# Patient Record
Sex: Male | Born: 1950 | Race: White | Hispanic: No | Marital: Married | Smoking: Never smoker
Health system: Southern US, Community
[De-identification: ages and names within clinical notes are randomized; demographics above are authoritative.]

## PROBLEM LIST (undated history)

## (undated) DIAGNOSIS — E119 Type 2 diabetes mellitus without complications: Secondary | ICD-10-CM

## (undated) DIAGNOSIS — I10 Essential (primary) hypertension: Secondary | ICD-10-CM

## (undated) DIAGNOSIS — I4891 Unspecified atrial fibrillation: Secondary | ICD-10-CM

---

## 2018-05-30 ENCOUNTER — Encounter (HOSPITAL_BASED_OUTPATIENT_CLINIC_OR_DEPARTMENT_OTHER): Payer: Self-pay | Admitting: Emergency Medicine

## 2018-05-30 ENCOUNTER — Other Ambulatory Visit: Payer: Self-pay

## 2018-05-30 ENCOUNTER — Emergency Department (HOSPITAL_BASED_OUTPATIENT_CLINIC_OR_DEPARTMENT_OTHER): Payer: Medicare HMO

## 2018-05-30 ENCOUNTER — Emergency Department (HOSPITAL_BASED_OUTPATIENT_CLINIC_OR_DEPARTMENT_OTHER)
Admission: EM | Admit: 2018-05-30 | Discharge: 2018-05-30 | Disposition: A | Discharge status: Home / Self Care | Payer: Medicare HMO | Attending: Emergency Medicine | Admitting: Emergency Medicine

## 2018-05-30 DIAGNOSIS — Y929 Unspecified place or not applicable: Secondary | ICD-10-CM | POA: Insufficient documentation

## 2018-05-30 DIAGNOSIS — Y999 Unspecified external cause status: Secondary | ICD-10-CM | POA: Diagnosis not present

## 2018-05-30 DIAGNOSIS — Z7984 Long term (current) use of oral hypoglycemic drugs: Secondary | ICD-10-CM | POA: Diagnosis not present

## 2018-05-30 DIAGNOSIS — Z7982 Long term (current) use of aspirin: Secondary | ICD-10-CM | POA: Diagnosis not present

## 2018-05-30 DIAGNOSIS — I4891 Unspecified atrial fibrillation: Secondary | ICD-10-CM | POA: Insufficient documentation

## 2018-05-30 DIAGNOSIS — E119 Type 2 diabetes mellitus without complications: Secondary | ICD-10-CM | POA: Insufficient documentation

## 2018-05-30 DIAGNOSIS — I10 Essential (primary) hypertension: Secondary | ICD-10-CM | POA: Diagnosis not present

## 2018-05-30 DIAGNOSIS — X58XXXA Exposure to other specified factors, initial encounter: Secondary | ICD-10-CM | POA: Insufficient documentation

## 2018-05-30 DIAGNOSIS — Z79899 Other long term (current) drug therapy: Secondary | ICD-10-CM | POA: Diagnosis not present

## 2018-05-30 DIAGNOSIS — S76211A Strain of adductor muscle, fascia and tendon of right thigh, initial encounter: Secondary | ICD-10-CM

## 2018-05-30 DIAGNOSIS — S79921A Unspecified injury of right thigh, initial encounter: Secondary | ICD-10-CM | POA: Diagnosis present

## 2018-05-30 DIAGNOSIS — Y939 Activity, unspecified: Secondary | ICD-10-CM | POA: Diagnosis not present

## 2018-05-30 HISTORY — DX: Unspecified atrial fibrillation: I48.91

## 2018-05-30 HISTORY — DX: Type 2 diabetes mellitus without complications: E11.9

## 2018-05-30 HISTORY — DX: Essential (primary) hypertension: I10

## 2018-05-30 MED ORDER — PREDNISONE 10 MG PO TABS: ORAL_TABLET | ORAL | 0 refills | Status: AC

## 2018-05-30 NOTE — ED Notes (Signed)
ED Provider at bedside. 

## 2018-05-30 NOTE — ED Triage Notes (Signed)
Reports hip injury in inversion table x 2 weeks.  Reports limping gait.  Ambulatory to triage in NAD.

## 2018-05-30 NOTE — Discharge Instructions (Signed)
See your Orthopaedist for recheck next week if symptoms persist

## 2018-05-30 NOTE — ED Provider Notes (Signed)
[facility] EMERGENCY DEPARTMENT Provider Note   CSN: [id number] Arrival date & time: 05/30/18  1946     History   Chief Complaint Chief Complaint  Patient presents with  . Hip Pain    HPI Nicholas Stevenson is a 67 y.o. male.  The history is provided by the patient. No language interpreter was used.  Hip Pain  This is a new problem. Episode onset: 2 weeks. The problem occurs constantly. Pertinent negatives include no abdominal pain. Nothing aggravates the symptoms. Nothing relieves the symptoms. He has tried nothing for the symptoms. The treatment provided no relief.   Pt reports he thinks he strained right groin on inversion table Pt reports he has had pain for 2 weeks.   Past Medical History:  Diagnosis Date  . Atrial fibrillation (HCC)   . Diabetes mellitus without complication (HCC)   . Hypertension     There are no active problems to display for this patient.   History reviewed. No pertinent surgical history.      Home Medications    Prior to Admission medications   Medication Sig Start Date End Date Taking? Authorizing Provider  aspirin EC 81 MG tablet Take 81 mg by mouth daily.   Yes [provider]  Celecoxib (CELEBREX PO) Take by mouth.   Yes [provider]  dilTIAZem HCl Coated Beads (DILTIAZEM CD PO) Take by mouth.   Yes [provider]  LISINOPRIL PO Take by mouth.   Yes [provider]  METFORMIN HCL PO Take by mouth.   Yes [provider]  METOPROLOL TARTRATE PO Take by mouth.   Yes [provider]    Family History History reviewed. No pertinent family history.  Social History Social History   Tobacco Use  . Smoking status: Never Smoker  . Smokeless tobacco: Never Used  Substance Use Topics  . Alcohol use: Never    Frequency: Never  . Drug use: Never     Allergies   Patient has no known allergies.   Review of Systems Review of Systems  Gastrointestinal: Negative for  abdominal pain.  All other systems reviewed and are negative.    Physical Exam Updated Vital Signs BP 128/68 (BP Location: Right Arm)   Pulse 61   Temp (!) 97.5 F (36.4 C) (Oral)   Resp 16   Ht 6' (1.829 m)   Wt 104.3 kg   SpO2 98%   BMI 31.19 kg/m   Physical Exam  Constitutional: He is oriented to person, place, and time. He appears well-developed and well-nourished.  HENT:  Head: Normocephalic.  Eyes: EOM are normal.  Neck: Normal range of motion.  Pulmonary/Chest: Effort normal.  Abdominal: He exhibits no distension.  Musculoskeletal: He exhibits tenderness.  Right groin and right hip abductors.  No pain with range of motion of hip   Neurological: He is alert and oriented to person, place, and time.  Psychiatric: He has a normal mood and affect.  Nursing note and vitals reviewed.    ED Treatments / Results  Labs (all labs ordered are listed, but only abnormal results are displayed) Labs Reviewed - No data to display  EKG None  Radiology Dg Hip Unilat W Or Wo Pelvis 2-3 Views Right  Result Date: 05/30/2018 CLINICAL DATA:  Acute onset right groin pain EXAM: DG HIP (WITH OR WITHOUT PELVIS) 2-3V RIGHT COMPARISON:  None. FINDINGS: There is no evidence of hip fracture or dislocation. There is no evidence of arthropathy or other focal  bone abnormality. Soft tissue calcification in the soft tissues along the medial aspect of the proximal femoral diaphysis. IMPRESSION: No acute osseous injury of the right hip. Electronically Signed   By: Elige Ko   On: 05/30/2018 20:38    Procedures Procedures (including critical care time)  Medications Ordered in ED Medications - No data to display   Initial Impression / Assessment and Plan / ED Course  I have reviewed the triage vital signs and the nursing notes.  Pertinent labs & imaging results that were available during my care of the patient were reviewed by me and considered in my medical decision making (see chart for  details).     MDM  Pt reports he has done well with prednisone in the past.  Pt declines medication for pain.   Pt advised to see his MD for recheck in 1 week.  Rest,  Use crutches.   Final Clinical Impressions(s) / ED Diagnoses   Final diagnoses:  Groin strain, right, initial encounter    ED Discharge Orders         Ordered    predniSONE (DELTASONE) 10 MG tablet     05/30/18 2247        An After Visit Summary was printed and given to the patient.    Elson Areas, [place] 05/31/18 6045    Little, Ambrose Finland, MD 05/31/18 [phone]

## 2019-09-10 IMAGING — DX DG HIP (WITH OR WITHOUT PELVIS) 2-3V*R*
3 series · 3 of 3 positions shown · non-contrast
Comparison: None.

CLINICAL DATA: Acute onset right groin pain

EXAM:
DG HIP (WITH OR WITHOUT PELVIS) 2-3V RIGHT

[hip ap]
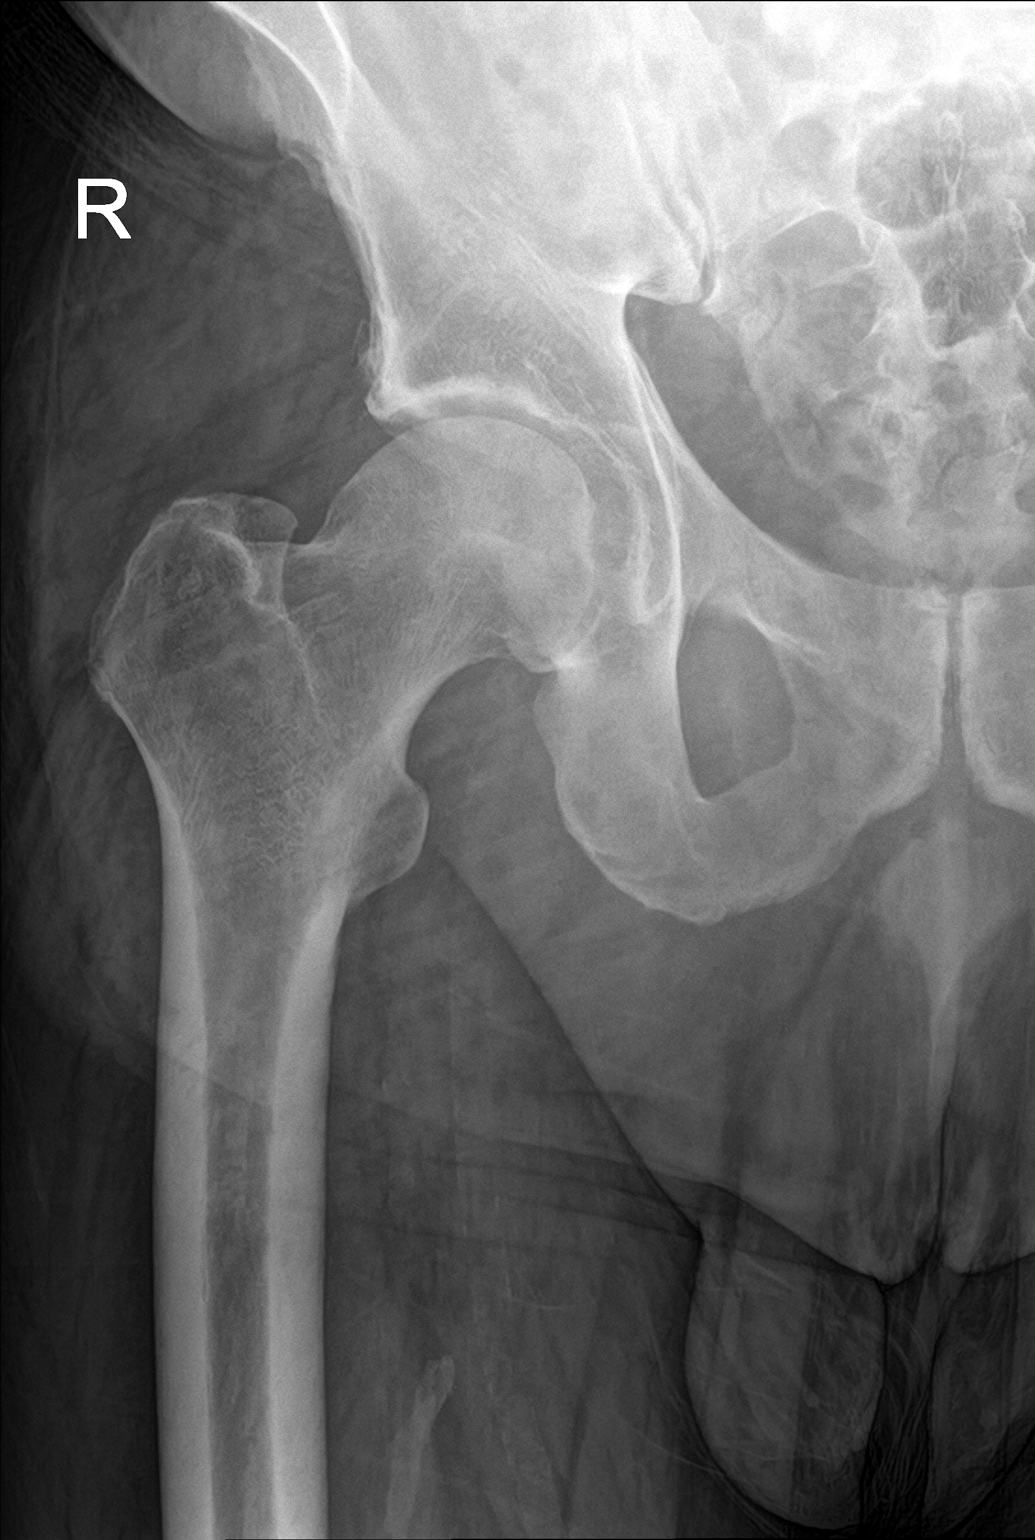

[hip lat]
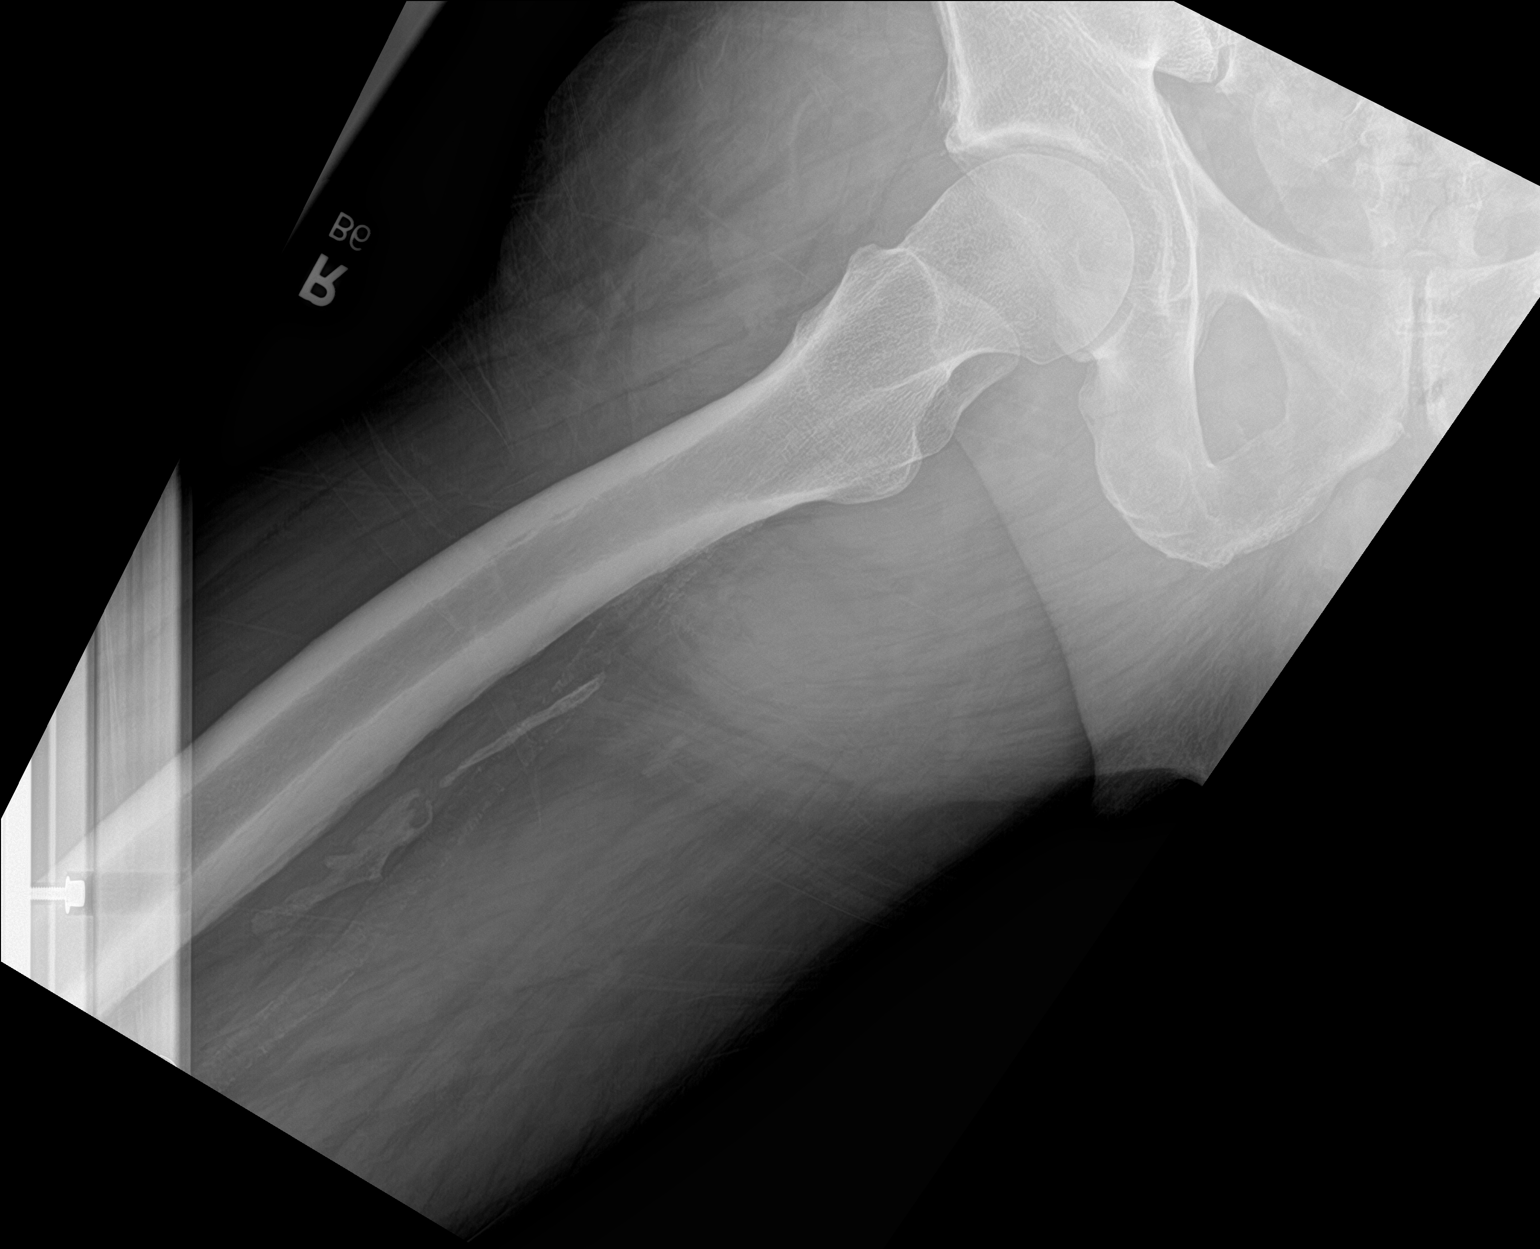

[pelvis ap]
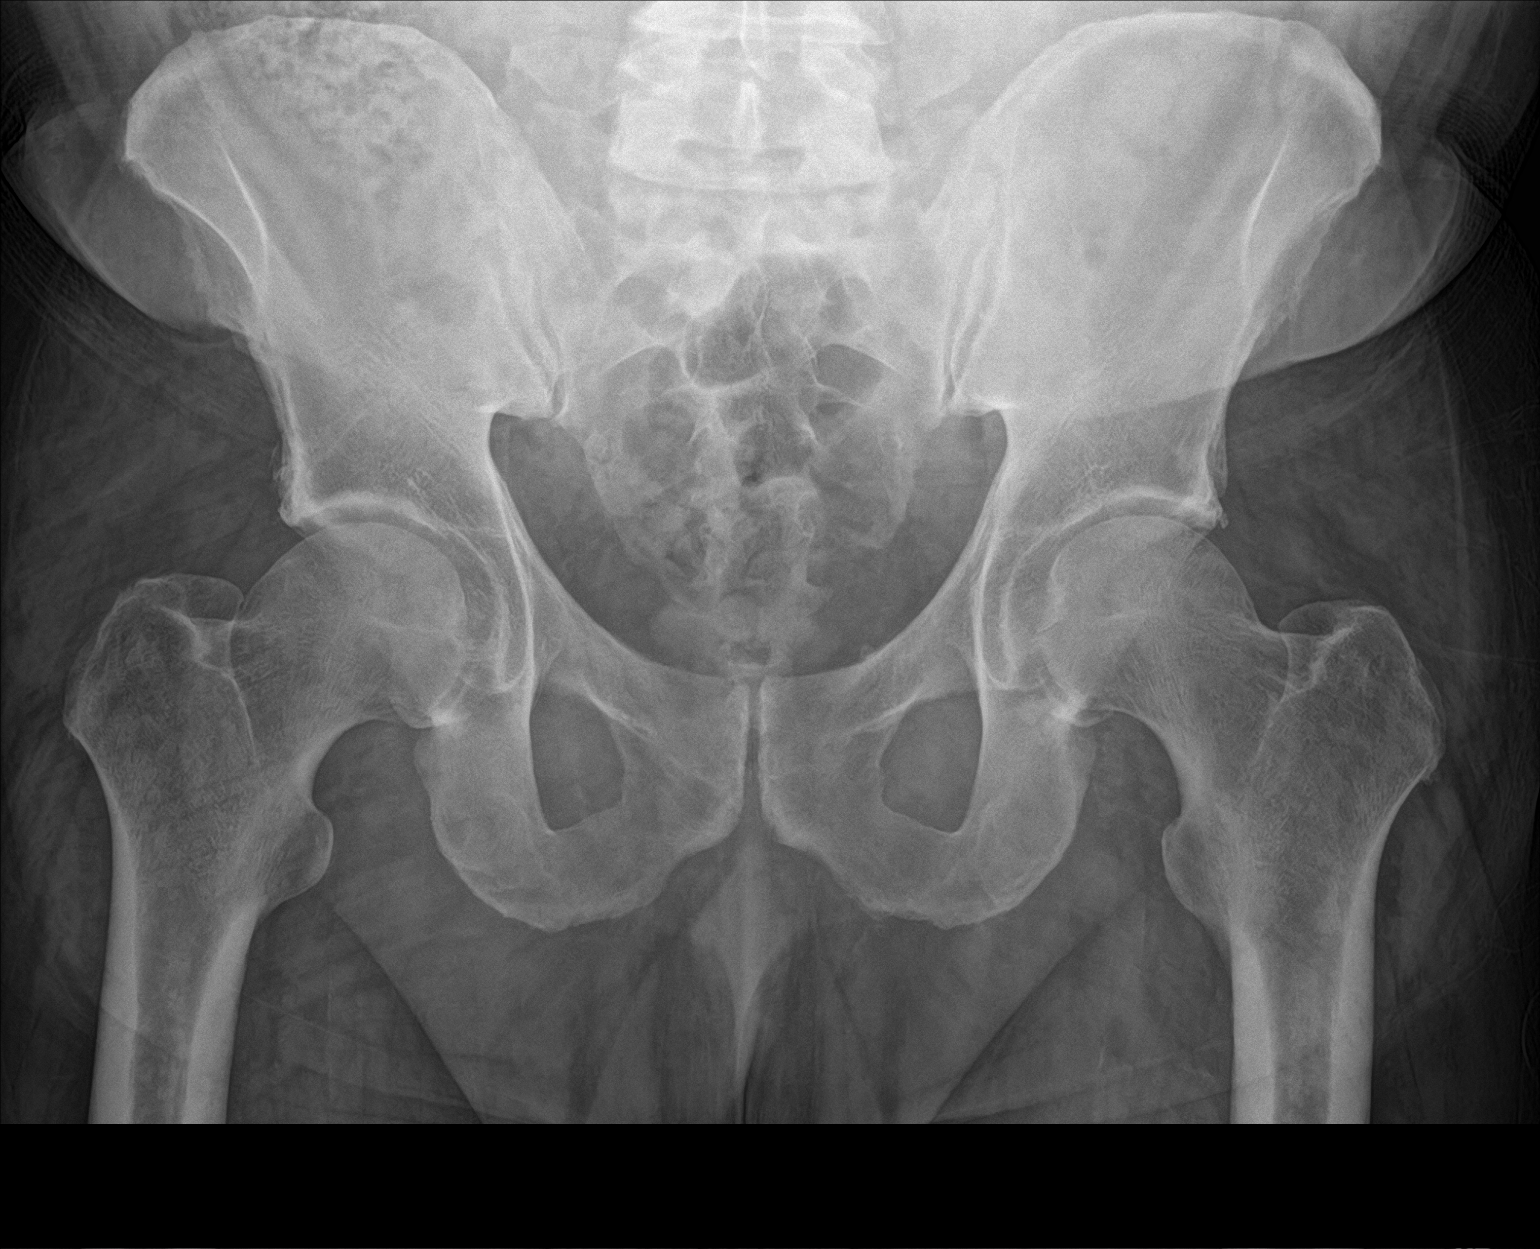

[3 of 3 positions shown; findings below may reference images not displayed]

FINDINGS: There is no evidence of hip fracture or dislocation. There is no
evidence of arthropathy or other focal bone abnormality. Soft tissue
calcification in the soft tissues along the medial aspect of the
proximal femoral diaphysis.
IMPRESSION: No acute osseous injury of the right hip.

## 2019-11-06 ENCOUNTER — Emergency Department (HOSPITAL_BASED_OUTPATIENT_CLINIC_OR_DEPARTMENT_OTHER): Payer: Medicare PPO

## 2019-11-06 ENCOUNTER — Other Ambulatory Visit: Payer: Self-pay

## 2019-11-06 ENCOUNTER — Emergency Department (HOSPITAL_BASED_OUTPATIENT_CLINIC_OR_DEPARTMENT_OTHER)
Admission: EM | Admit: 2019-11-06 | Discharge: 2019-11-06 | Disposition: A | Discharge status: Home / Self Care | Payer: Medicare PPO | Attending: Emergency Medicine | Admitting: Emergency Medicine

## 2019-11-06 ENCOUNTER — Encounter (HOSPITAL_BASED_OUTPATIENT_CLINIC_OR_DEPARTMENT_OTHER): Payer: Self-pay | Admitting: *Deleted

## 2019-11-06 DIAGNOSIS — Z79899 Other long term (current) drug therapy: Secondary | ICD-10-CM | POA: Diagnosis not present

## 2019-11-06 DIAGNOSIS — Y9364 Activity, baseball: Secondary | ICD-10-CM | POA: Diagnosis not present

## 2019-11-06 DIAGNOSIS — I10 Essential (primary) hypertension: Secondary | ICD-10-CM | POA: Diagnosis not present

## 2019-11-06 DIAGNOSIS — I4891 Unspecified atrial fibrillation: Secondary | ICD-10-CM | POA: Diagnosis not present

## 2019-11-06 DIAGNOSIS — Y999 Unspecified external cause status: Secondary | ICD-10-CM | POA: Diagnosis not present

## 2019-11-06 DIAGNOSIS — W51XXXA Accidental striking against or bumped into by another person, initial encounter: Secondary | ICD-10-CM | POA: Insufficient documentation

## 2019-11-06 DIAGNOSIS — S86812A Strain of other muscle(s) and tendon(s) at lower leg level, left leg, initial encounter: Secondary | ICD-10-CM

## 2019-11-06 DIAGNOSIS — Y92320 Baseball field as the place of occurrence of the external cause: Secondary | ICD-10-CM | POA: Diagnosis not present

## 2019-11-06 DIAGNOSIS — S86112A Strain of other muscle(s) and tendon(s) of posterior muscle group at lower leg level, left leg, initial encounter: Secondary | ICD-10-CM | POA: Diagnosis not present

## 2019-11-06 DIAGNOSIS — S8992XA Unspecified injury of left lower leg, initial encounter: Secondary | ICD-10-CM | POA: Diagnosis present

## 2019-11-06 DIAGNOSIS — E119 Type 2 diabetes mellitus without complications: Secondary | ICD-10-CM | POA: Insufficient documentation

## 2019-11-06 DIAGNOSIS — Z7982 Long term (current) use of aspirin: Secondary | ICD-10-CM | POA: Diagnosis not present

## 2019-11-06 DIAGNOSIS — M79605 Pain in left leg: Secondary | ICD-10-CM | POA: Insufficient documentation

## 2019-11-06 NOTE — ED Provider Notes (Signed)
[facility] EMERGENCY DEPARTMENT Provider Note   CSN: [id number] Arrival date & time: 11/06/19  1755     History Chief Complaint  Patient presents with  . Leg Pain  . Leg Injury    Nicholas Stevenson is a 69 y.o. male.  Patient is a 69 year old male with past medical history of atrial fibrillation, diabetes, hypertension.  He presents today for evaluation of left calf pain.  Patient was playing in a baseball game 3 weeks ago when another player slid into him.  He has had pain in his lower calf and Achilles since.  He denies any swelling.  He denies any chest pain or difficulty breathing.  The injury occurred in [place] and the patient has traveled there and back by car.  The history is provided by the patient.  Leg Pain Lower extremity pain location: Calf. Time since incident:  3 weeks Pain details:    Quality:  Aching   Radiates to:  Does not radiate   Severity:  Moderate   Onset quality:  Sudden   Timing:  Constant   Progression:  Unchanged Chronicity:  New Relieved by:  Nothing Worsened by:  Activity Ineffective treatments:  None tried      Past Medical History:  Diagnosis Date  . Atrial fibrillation ([place])   . Diabetes mellitus without complication ([place])   . Hypertension     There are no problems to display for this patient.   History reviewed. No pertinent surgical history.     No family history on file.  Social History   Tobacco Use  . Smoking status: Never Smoker  . Smokeless tobacco: Never Used  Substance Use Topics  . Alcohol use: Never  . Drug use: Never    Home Medications Prior to Admission medications   Medication Sig Start Date End Date Taking? Authorizing Provider  aspirin EC 81 MG tablet Take 81 mg by mouth daily.    [provider]  Celecoxib (CELEBREX PO) Take by mouth.    [provider]  dilTIAZem HCl Coated Beads (DILTIAZEM CD PO) Take by mouth.    [provider]  LISINOPRIL PO Take by mouth.     [provider]  METFORMIN HCL PO Take by mouth.    [provider]  METOPROLOL TARTRATE PO Take by mouth.    [provider]  predniSONE (DELTASONE) 10 MG tablet 6,5,4,3,2,1, taper 05/30/18   Fransico Meadow, PA-C    Allergies    Patient has no known allergies.  Review of Systems   Review of Systems  All other systems reviewed and are negative.   Physical Exam Updated Vital Signs BP 129/65 (BP Location: Left Arm)   Pulse 60   Temp 98 F (36.7 C) (Oral)   Resp 18   Ht 5\' 10"  (1.778 m)   Wt 105.7 kg   SpO2 100%   BMI 33.43 kg/m   Physical Exam Vitals and nursing note reviewed.  Constitutional:      General: He is not in acute distress.    Appearance: Normal appearance. He is not ill-appearing, toxic-appearing or diaphoretic.  HENT:     Head: Normocephalic and atraumatic.  Musculoskeletal:     Comments: The left ankle appears grossly normal and equal to the right.  There is mild tenderness over the inferior aspect of the calf and upper Achilles.  Patient is able to plantar and dorsiflex his ankle with no limitation.  There is no edema and DP pulses are easily  palpable.  Skin:    General: Skin is warm and dry.  Neurological:     General: No focal deficit present.     Mental Status: He is alert and oriented to person, place, and time.     ED Results / Procedures / Treatments   Labs (all labs ordered are listed, but only abnormal results are displayed) Labs Reviewed - No data to display  EKG None  Radiology No results found.  Procedures Procedures (including critical care time)  Medications Ordered in ED Medications - No data to display  ED Course  I have reviewed the triage vital signs and the nursing notes.  Pertinent labs & imaging results that were available during my care of the patient were reviewed by me and considered in my medical decision making (see chart for details).    MDM Rules/Calculators/A&P  Ultrasound is  negative for DVT, but does show fluid adjacent to the calf musculature suggestive of a calf strain.  I highly suspect that this is the cause of his discomfort and do not feel as though further work-up is indicated.  He has no sign of the Achilles having ruptured.  He is able to plantar flex without difficulty.  Patient will be discharged with continued rest and follow-up with primary doctor if not improving in the next week.  Final Clinical Impression(s) / ED Diagnoses Final diagnoses:  None    Rx / DC Orders ED Discharge Orders    None       Geoffery Lyons, MD 11/06/19 2025

## 2019-11-06 NOTE — Discharge Instructions (Addendum)
Take ibuprofen 600 mg every 6 hours as needed for pain.  Continue applying the heating pad to the affected area.  If symptoms or not improving in 1 week, follow-up with your primary doctor to discuss additional testing.  Return to the emergency department in the meantime if symptoms significantly worsen or change.

## 2019-11-06 NOTE — ED Triage Notes (Signed)
Pt. Reports pain in the lower L leg at the back side close to his calf and heel for 3 weeks after a baseball game he played in.

## 2020-02-22 ENCOUNTER — Encounter (HOSPITAL_BASED_OUTPATIENT_CLINIC_OR_DEPARTMENT_OTHER): Payer: Self-pay

## 2020-02-22 ENCOUNTER — Other Ambulatory Visit: Payer: Self-pay

## 2020-02-22 ENCOUNTER — Emergency Department (HOSPITAL_BASED_OUTPATIENT_CLINIC_OR_DEPARTMENT_OTHER)
Admission: EM | Admit: 2020-02-22 | Discharge: 2020-02-22 | Disposition: A | Discharge status: Home / Self Care | Payer: Medicare PPO | Attending: Emergency Medicine | Admitting: Emergency Medicine

## 2020-02-22 DIAGNOSIS — E119 Type 2 diabetes mellitus without complications: Secondary | ICD-10-CM | POA: Insufficient documentation

## 2020-02-22 DIAGNOSIS — L03114 Cellulitis of left upper limb: Secondary | ICD-10-CM | POA: Diagnosis not present

## 2020-02-22 DIAGNOSIS — Z79899 Other long term (current) drug therapy: Secondary | ICD-10-CM | POA: Insufficient documentation

## 2020-02-22 DIAGNOSIS — I10 Essential (primary) hypertension: Secondary | ICD-10-CM | POA: Diagnosis not present

## 2020-02-22 DIAGNOSIS — Z7982 Long term (current) use of aspirin: Secondary | ICD-10-CM | POA: Diagnosis not present

## 2020-02-22 DIAGNOSIS — R2230 Localized swelling, mass and lump, unspecified upper limb: Secondary | ICD-10-CM | POA: Diagnosis present

## 2020-02-22 DIAGNOSIS — Z7984 Long term (current) use of oral hypoglycemic drugs: Secondary | ICD-10-CM | POA: Diagnosis not present

## 2020-02-22 MED ORDER — DOXYCYCLINE HYCLATE 100 MG PO CAPS: 100.0000 mg | ORAL_CAPSULE | Freq: Two times a day (BID) | ORAL | 0 refills | Status: AC

## 2020-02-22 MED ORDER — DOXYCYCLINE HYCLATE 100 MG PO CAPS: 100.0000 mg | ORAL_CAPSULE | Freq: Two times a day (BID) | ORAL | 0 refills | Status: DC

## 2020-02-22 MED ORDER — DOXYCYCLINE HYCLATE 100 MG PO TABS
100.0000 mg | ORAL_TABLET | Freq: Once | ORAL | Status: AC
  Administered 2020-02-22: 100 mg via ORAL
  Filled 2020-02-22: qty 1

## 2020-02-22 NOTE — ED Provider Notes (Signed)
[facility] EMERGENCY DEPARTMENT Provider Note   CSN: [id number] Arrival date & time: 02/22/20  1931     History Chief Complaint  Patient presents with  . Arm Swelling    Nicholas Stevenson is a 69 y.o. male.  Patient with an area of redness and swelling and a area of lump on his left forearm.  So has a paronychia to his ring finger on that side.  That has been expressing some pus.  Both came up around Saturday about 3 days ago.  Patient states that the swelling and lump is gotten smaller on the left forearm area.  No fevers no systemic symptoms.  Patient states he is on steroids.  Patient not tachycardic here does have a history of atrial fibrillation.  No history of any injury or wound.        Past Medical History:  Diagnosis Date  . Atrial fibrillation ([place])   . Diabetes mellitus without complication ([place])   . Hypertension     There are no problems to display for this patient.   History reviewed. No pertinent surgical history.     No family history on file.  Social History   Tobacco Use  . Smoking status: Never Smoker  . Smokeless tobacco: Never Used  Vaping Use  . Vaping Use: Never used  Substance Use Topics  . Alcohol use: Never  . Drug use: Never    Home Medications Prior to Admission medications   Medication Sig Start Date End Date Taking? Authorizing Provider  aspirin EC 81 MG tablet Take 81 mg by mouth daily.    [provider]  Celecoxib (CELEBREX PO) Take by mouth.    [provider]  dilTIAZem HCl Coated Beads (DILTIAZEM CD PO) Take by mouth.    [provider]  doxycycline (VIBRAMYCIN) 100 MG capsule Take 1 capsule (100 mg total) by mouth 2 (two) times daily. 02/22/20   Fredia Sorrow, MD  LISINOPRIL PO Take by mouth.    [provider]  METFORMIN HCL PO Take by mouth.    [provider]  METOPROLOL TARTRATE PO Take by mouth.    [provider]  predniSONE (DELTASONE) 10 MG tablet  6,5,4,3,2,1, taper 05/30/18   Fransico Meadow, PA-C    Allergies    Patient has no known allergies.  Review of Systems   Review of Systems  Constitutional: Negative for chills and fever.  HENT: Negative for congestion, rhinorrhea and sore throat.   Eyes: Negative for visual disturbance.  Respiratory: Negative for cough and shortness of breath.   Cardiovascular: Negative for chest pain and leg swelling.  Gastrointestinal: Negative for abdominal pain, diarrhea, nausea and vomiting.  Genitourinary: Negative for dysuria.  Musculoskeletal: Negative for back pain and neck pain.  Skin: Negative for rash.  Neurological: Negative for dizziness, light-headedness and headaches.  Hematological: Does not bruise/bleed easily.  Psychiatric/Behavioral: Negative for confusion.    Physical Exam Updated Vital Signs BP 136/74 (BP Location: Left Arm)   Pulse 62   Temp 98.7 F (37.1 C) (Oral)   Resp 16   Ht 1.829 m (6')   Wt 103 kg   SpO2 98%   BMI 30.79 kg/m   Physical Exam Vitals and nursing note reviewed.  Constitutional:      Appearance: Normal appearance. He is well-developed.  HENT:     Head: Normocephalic and atraumatic.  Eyes:     Extraocular Movements: Extraocular movements intact.     Conjunctiva/sclera: Conjunctivae normal.  Pupils: Pupils are equal, round, and reactive to light.  Cardiovascular:     Rate and Rhythm: Normal rate and regular rhythm.     Heart sounds: No murmur heard.   Pulmonary:     Effort: Pulmonary effort is normal. No respiratory distress.     Breath sounds: Normal breath sounds.  Abdominal:     Palpations: Abdomen is soft.     Tenderness: There is no abdominal tenderness.  Musculoskeletal:        General: Swelling present. Normal range of motion.     Cervical back: Normal range of motion and neck supple.     Comments: Left forearm with area of some induration without fluctuance measuring about 3 cm.  Area of erythema measuring about 10 cm.  They  ring finger on that side has evidence of a healing paronychia.  Appears to be no red streaking connecting the 2.  Good range of motion at the wrist and elbow and fingers.  Radial pulses 2+.  Sensation intact.  No break in the skin.  There is erythema there is also increased warmth.  No rash.  Skin:    General: Skin is warm and dry.     Capillary Refill: Capillary refill takes less than 2 seconds.  Neurological:     General: No focal deficit present.     Mental Status: He is alert and oriented to person, place, and time.     Cranial Nerves: No cranial nerve deficit.     Sensory: No sensory deficit.     Motor: No weakness.     ED Results / Procedures / Treatments   Labs (all labs ordered are listed, but only abnormal results are displayed) Labs Reviewed - No data to display  EKG None  Radiology No results found.  Procedures Procedures (including critical care time)  Medications Ordered in ED Medications  doxycycline (VIBRA-TABS) tablet 100 mg (has no administration in time range)    ED Course  I have reviewed the triage vital signs and the nursing notes.  Pertinent labs & imaging results that were available during my care of the patient were reviewed by me and considered in my medical decision making (see chart for details).    MDM Rules/Calculators/A&P                          Most likely cellulitis.  Possibly could have be connected to the paronychia.  But there is no connection of red streaking.  Will treat with doxycycline.  Patient states overall things are improving.  No evidence of any significant complications.  Good radial pulse good cap refill sensation intact.  Patient will start the doxycycline if no improvement in 2 days will either follow-up here or with his primary care doctor.  Patient will return for any new or worse symptoms.   Final Clinical Impression(s) / ED Diagnoses Final diagnoses:  Cellulitis of left upper extremity    Rx / DC Orders ED  Discharge Orders         Ordered    doxycycline (VIBRAMYCIN) 100 MG capsule  2 times daily     Discontinue  Reprint     02/22/20 2200           Vanetta Mulders, MD 02/22/20 2204

## 2020-02-22 NOTE — ED Triage Notes (Signed)
Pt c/o redness/swelling to left UE and left ring finger started 6/18-denies known break in skin/injury-NAD-steady gait

## 2020-02-22 NOTE — Discharge Instructions (Signed)
The antibiotic doxycycline as directed for the next 7 days.  Return here or follow-up with your doctor for any new or worse symptoms should expect improvement after couple days.

## 2020-02-28 ENCOUNTER — Emergency Department (HOSPITAL_BASED_OUTPATIENT_CLINIC_OR_DEPARTMENT_OTHER)
Admission: EM | Admit: 2020-02-28 | Discharge: 2020-02-28 | Disposition: A | Discharge status: Home / Self Care | Payer: Medicare PPO | Attending: Emergency Medicine | Admitting: Emergency Medicine

## 2020-02-28 ENCOUNTER — Encounter (HOSPITAL_BASED_OUTPATIENT_CLINIC_OR_DEPARTMENT_OTHER): Payer: Self-pay | Admitting: Emergency Medicine

## 2020-02-28 DIAGNOSIS — R2232 Localized swelling, mass and lump, left upper limb: Secondary | ICD-10-CM | POA: Diagnosis not present

## 2020-02-28 DIAGNOSIS — Z7984 Long term (current) use of oral hypoglycemic drugs: Secondary | ICD-10-CM | POA: Insufficient documentation

## 2020-02-28 DIAGNOSIS — E119 Type 2 diabetes mellitus without complications: Secondary | ICD-10-CM | POA: Diagnosis not present

## 2020-02-28 DIAGNOSIS — M7989 Other specified soft tissue disorders: Secondary | ICD-10-CM

## 2020-02-28 DIAGNOSIS — M79645 Pain in left finger(s): Secondary | ICD-10-CM | POA: Diagnosis not present

## 2020-02-28 DIAGNOSIS — Z79899 Other long term (current) drug therapy: Secondary | ICD-10-CM | POA: Insufficient documentation

## 2020-02-28 DIAGNOSIS — I10 Essential (primary) hypertension: Secondary | ICD-10-CM | POA: Diagnosis not present

## 2020-02-28 DIAGNOSIS — Z7982 Long term (current) use of aspirin: Secondary | ICD-10-CM | POA: Insufficient documentation

## 2020-02-28 NOTE — ED Triage Notes (Signed)
Was seen this week for redness and swelling to L arm, dx with cellulitis. States symptoms are no better

## 2020-02-28 NOTE — Discharge Instructions (Addendum)
Use tylenol or ibuprofen as needed for pain.  Wash your finger daily with soap and water, and apply a new dressing daily.  Use ice as needed for pain and swelling of your arm.  Follow up with your primary care doctor for further evaluation.  Return to the ER with any new, worsening, or concerning symptoms.

## 2020-02-28 NOTE — ED Provider Notes (Signed)
[facility] EMERGENCY DEPARTMENT Provider Note   CSN: [id number] Arrival date & time: 02/28/20  1548     History Chief Complaint  Patient presents with  . Arm Pain    Nicholas Stevenson is a 69 y.o. male presenting for evaluation of left finger pain and left arm swelling.  Patient states he was seen last week for the same.  He was started on antibiotics, and since then his pain and redness of his left forearm have resolved.  However he continues to have swelling of the proximal left forearm.  No pain in the elbow, no pain with movement.  No numbness or tingling.  He denies trauma or injury.  No cuts to the area. Additionally, patient reports he has pain of his left ring finger.  It began as a paronychia, he opened it himself and pulled off the dead skin.  He states it is improving and becoming less painful and less red, however there is a spot in the middle of the lesion that is still tender.  It occasionally drains serous fluid.  He denies pain at the tip of his finger.  He is using peroxide on it, otherwise not applying anything.  HPI     Past Medical History:  Diagnosis Date  . Atrial fibrillation (HCC)   . Diabetes mellitus without complication (HCC)   . Hypertension     There are no problems to display for this patient.   History reviewed. No pertinent surgical history.     No family history on file.  Social History   Tobacco Use  . Smoking status: Never Smoker  . Smokeless tobacco: Never Used  Vaping Use  . Vaping Use: Never used  Substance Use Topics  . Alcohol use: Never  . Drug use: Never    Home Medications Prior to Admission medications   Medication Sig Start Date End Date Taking? Authorizing Provider  aspirin EC 81 MG tablet Take 81 mg by mouth daily.    [provider]  Celecoxib (CELEBREX PO) Take by mouth.    [provider]  dilTIAZem HCl Coated Beads (DILTIAZEM CD PO) Take by mouth.    [provider]  doxycycline  (VIBRAMYCIN) 100 MG capsule Take 1 capsule (100 mg total) by mouth 2 (two) times daily. 02/22/20   Vanetta Mulders, MD  LISINOPRIL PO Take by mouth.    [provider]  METFORMIN HCL PO Take by mouth.    [provider]  METOPROLOL TARTRATE PO Take by mouth.    [provider]  predniSONE (DELTASONE) 10 MG tablet 6,5,4,3,2,1, taper 05/30/18   Elson Areas, PA-C    Allergies    Patient has no known allergies.  Review of Systems   Review of Systems  Musculoskeletal:       Swelling of L proximal forearm  Skin: Positive for wound.    Physical Exam Updated Vital Signs BP 119/74   Pulse 60   Temp 98 F (36.7 C)   Resp 18   SpO2 97%   Physical Exam Vitals and nursing note reviewed.  Constitutional:      General: He is not in acute distress.    Appearance: He is well-developed.     Comments: Resting in the bed in NAD  HENT:     Head: Normocephalic and atraumatic.  Pulmonary:     Effort: Pulmonary effort is normal.  Abdominal:     General: There is no distension.  Musculoskeletal:  General: Normal range of motion.     Cervical back: Normal range of motion.     Comments: See pictures below.  Swelling of the proximal left forearm.  No skin induration, minimal erythema.  No tenderness.  Full active range of motion of the elbow without pain.  No pain elsewhere in the forearm, compartments are soft. Healing lesion of the left ring finger with central area of granulation tissue.  No purulence.  Good distal sensation and cap refill of the finger.  Skin:    General: Skin is warm.     Findings: No rash.  Neurological:     Mental Status: He is alert and oriented to person, place, and time.            ED Results / Procedures / Treatments   Labs (all labs ordered are listed, but only abnormal results are displayed) Labs Reviewed - No data to display  EKG None  Radiology No results found.  Procedures Procedures (including critical  care time)  Medications Ordered in ED Medications - No data to display  ED Course  I have reviewed the triage vital signs and the nursing notes.  Pertinent labs & imaging results that were available during my care of the patient were reviewed by me and considered in my medical decision making (see chart for details).    MDM Rules/Calculators/A&P                          Patient presenting for evaluation of lesion of his forearm as well as finger wound.  Forearm lesion is no longer tender, indurated, or warm.  On exam, no clinical signs of infection.  Bedside ultrasound did not reveal significant fluid pocket.  Consider lipoma.  Consider inflammation.  Less likely cyst or abscess with no fluid noted on ultrasound.  Will have patient continue compress and follow-up with PCP for further evaluation. Additionally, patient presenting for wound of in his ring finger.  On exam, appears it is healing.  There is granulation tissue in the center of the wound, without active drainage.  Patient reports pain is improving.  He was recently on a course of antibiotics.  Discussed wound care, and follow-up with primary care for recheck as needed. Will place dressing in the ED with xeroform. At this time, patient appears safe for discharge.  Return precautions given.  Patient states he understands and agrees to plan.  Final Clinical Impression(s) / ED Diagnoses Final diagnoses:  Left arm swelling  Finger pain, left    Rx / DC Orders ED Discharge Orders    None       Franchot Heidelberg, PA-C 02/28/20 1857    Charlesetta Shanks, MD 02/28/20 1916

## 2020-03-20 ENCOUNTER — Other Ambulatory Visit: Payer: Self-pay

## 2020-03-20 ENCOUNTER — Emergency Department (HOSPITAL_BASED_OUTPATIENT_CLINIC_OR_DEPARTMENT_OTHER)
Admission: EM | Admit: 2020-03-20 | Discharge: 2020-03-20 | Disposition: A | Discharge status: Home / Self Care | Payer: Medicare PPO | Attending: Emergency Medicine | Admitting: Emergency Medicine

## 2020-03-20 ENCOUNTER — Encounter (HOSPITAL_BASED_OUTPATIENT_CLINIC_OR_DEPARTMENT_OTHER): Payer: Self-pay | Admitting: Emergency Medicine

## 2020-03-20 DIAGNOSIS — Z7984 Long term (current) use of oral hypoglycemic drugs: Secondary | ICD-10-CM | POA: Insufficient documentation

## 2020-03-20 DIAGNOSIS — E119 Type 2 diabetes mellitus without complications: Secondary | ICD-10-CM | POA: Diagnosis not present

## 2020-03-20 DIAGNOSIS — Y999 Unspecified external cause status: Secondary | ICD-10-CM | POA: Diagnosis not present

## 2020-03-20 DIAGNOSIS — I10 Essential (primary) hypertension: Secondary | ICD-10-CM | POA: Insufficient documentation

## 2020-03-20 DIAGNOSIS — Y929 Unspecified place or not applicable: Secondary | ICD-10-CM | POA: Diagnosis not present

## 2020-03-20 DIAGNOSIS — W010XXA Fall on same level from slipping, tripping and stumbling without subsequent striking against object, initial encounter: Secondary | ICD-10-CM | POA: Diagnosis not present

## 2020-03-20 DIAGNOSIS — Z7982 Long term (current) use of aspirin: Secondary | ICD-10-CM | POA: Diagnosis not present

## 2020-03-20 DIAGNOSIS — Z79899 Other long term (current) drug therapy: Secondary | ICD-10-CM | POA: Insufficient documentation

## 2020-03-20 DIAGNOSIS — S59912A Unspecified injury of left forearm, initial encounter: Secondary | ICD-10-CM | POA: Diagnosis present

## 2020-03-20 DIAGNOSIS — Y939 Activity, unspecified: Secondary | ICD-10-CM | POA: Diagnosis not present

## 2020-03-20 DIAGNOSIS — S5012XA Contusion of left forearm, initial encounter: Secondary | ICD-10-CM | POA: Diagnosis not present

## 2020-03-20 NOTE — ED Provider Notes (Signed)
[facility] EMERGENCY DEPARTMENT Provider Note   CSN: [id number] Arrival date & time: 03/20/20  1536     History Chief Complaint  Patient presents with  . Fall  . Arm Injury    Nicholas Stevenson is a 69 y.o. male with a past medical history of diabetes, hypertension, who presents today for evaluation of arm discoloration.  He fell about 5 to 6 days ago on his left arm.  This was a mechanical fall, nonsyncopal, due to the lip of the curb not being level with the pavement.  When he landed he landed on his left arm and knee.  He states that he is not having any pain there, has been able to go to batting practice without difficulty, however he has noticed a dark discoloration on his forearm.  He has a lump on his left forearm that he reports is previously evaluated and is unchanged from this.  He denies any fevers.  No headache or pain in legs.  He is not anticoagulated. He denies any other injury.   HPI     Past Medical History:  Diagnosis Date  . Atrial fibrillation (HCC)   . Diabetes mellitus without complication (HCC)   . Hypertension     There are no problems to display for this patient.   History reviewed. No pertinent surgical history.     No family history on file.  Social History   Tobacco Use  . Smoking status: Never Smoker  . Smokeless tobacco: Never Used  Vaping Use  . Vaping Use: Never used  Substance Use Topics  . Alcohol use: Never  . Drug use: Never    Home Medications Prior to Admission medications   Medication Sig Start Date End Date Taking? Authorizing Provider  aspirin EC 81 MG tablet Take 81 mg by mouth daily.    [provider]  Celecoxib (CELEBREX PO) Take by mouth.    [provider]  dilTIAZem HCl Coated Beads (DILTIAZEM CD PO) Take by mouth.    [provider]  doxycycline (VIBRAMYCIN) 100 MG capsule Take 1 capsule (100 mg total) by mouth 2 (two) times daily. 02/22/20   Vanetta Mulders, MD  LISINOPRIL PO  Take by mouth.    [provider]  METFORMIN HCL PO Take by mouth.    [provider]  METOPROLOL TARTRATE PO Take by mouth.    [provider]  predniSONE (DELTASONE) 10 MG tablet 6,5,4,3,2,1, taper 05/30/18   Elson Areas, PA-C    Allergies    Patient has no known allergies.  Review of Systems   Review of Systems  Constitutional: Negative for chills and fever.  Musculoskeletal: Negative for back pain and neck pain.  Skin: Positive for color change.  Neurological: Negative for weakness and headaches.  All other systems reviewed and are negative.   Physical Exam Updated Vital Signs BP 127/63 (BP Location: Left Arm)   Pulse 66   Temp 98.1 F (36.7 C) (Oral)   Resp 18   Ht 6' (1.829 m)   Wt 103 kg   SpO2 99%   BMI 30.79 kg/m   Physical Exam Vitals and nursing note reviewed.  Constitutional:      General: He is not in acute distress. HENT:     Head: Normocephalic and atraumatic.  Cardiovascular:     Rate and Rhythm: Normal rate and regular rhythm.     Pulses: Normal pulses.  Pulmonary:     Effort: Pulmonary effort is normal.  Musculoskeletal:     Comments: There is a ecchymosis on the left forearm on the posterior aspect.  There is no crepitus or or malaise.  There is edema localized on an area on the left posterior forearm.  The left arm has full, pain-free active range of motion.    Skin:    General: Skin is warm and dry.     Comments: Please see clinical images.  There is diffuse ecchymosis over the left forearm.  This does not blanch.  There are multiple superficial abrasions without abnormal erythema, induration or drainage.    Neurological:     General: No focal deficit present.     Mental Status: He is alert.  Psychiatric:        Mood and Affect: Mood normal.         ED Results / Procedures / Treatments   Labs (all labs ordered are listed, but only abnormal results are displayed) Labs Reviewed - No data to  display  EKG None  Radiology No results found.  Procedures Procedures (including critical care time)  Medications Ordered in ED Medications - No data to display  ED Course  I have reviewed the triage vital signs and the nursing notes.  Pertinent labs & imaging results that were available during my care of the patient were reviewed by me and considered in my medical decision making (see chart for details).    MDM Rules/Calculators/A&P                         Patient is a 69 year old man who presents today for evaluation after a fall that occurred almost a week ago.  He denies any pain in his left arm however is concerned for the discoloration on his arm.  He has no pain in the arm, reports he was able to go to batting practice since this started and has no pain, no indication for x-rays at this time.  There is a contusion only left forearm.  Recommended conservative care.  No evidence of secondary infection.  He denies striking his head or passing out, no concern for other injuries and issues evaluated today.  Return precautions were discussed with patient who states their understanding.  At the time of discharge patient denied any unaddressed complaints or concerns.  Patient is agreeable for discharge home.  Note: Portions of this report may have been transcribed using voice recognition software. Every effort was made to ensure accuracy; however, inadvertent computerized transcription errors may be present Final Clinical Impression(s) / ED Diagnoses Final diagnoses:  Contusion of left forearm, initial encounter    Rx / DC Orders ED Discharge Orders    None       Cristina Gong, PA-C 03/20/20 1821    Gwyneth Sprout, MD 03/21/20 2135

## 2020-03-20 NOTE — ED Triage Notes (Signed)
Pt reports trip and Fall on Monday or Tuesday causing injury to left forearm. Pt presents with dark discoloration and hematoma to left forearm.

## 2020-03-20 NOTE — Discharge Instructions (Signed)
If you develop pain, changes in the discoloration or have other concerns please seek additional medical care and evaluation.  I suspect that you have a bruise causing your symptoms.

## 2020-03-20 NOTE — ED Notes (Signed)
ED Provider at bedside. Dr. Plunkett 

## 2021-02-16 IMAGING — US US EXTREM LOW VENOUS*L*
1 series · 13 of 24 positions shown · non-contrast
Comparison: None.

CLINICAL DATA: Left calf pain



[Series 1: us extrem low venous*left* · 13 of 39 slices shown]
[im 1/39]
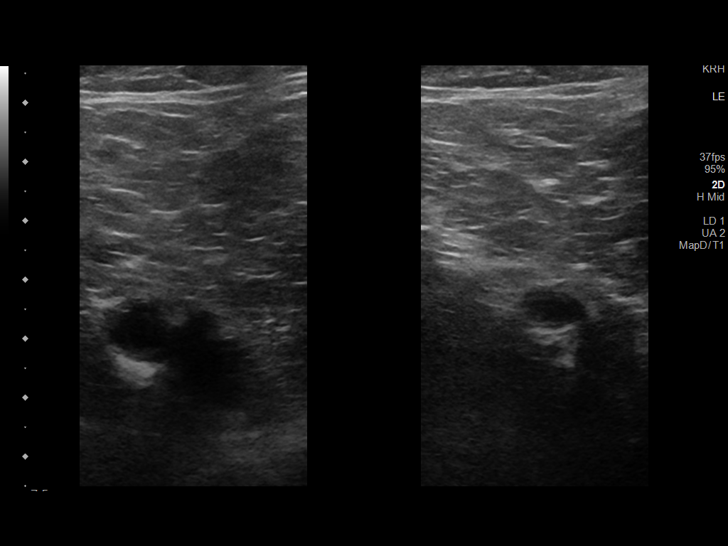
[im 4/39]
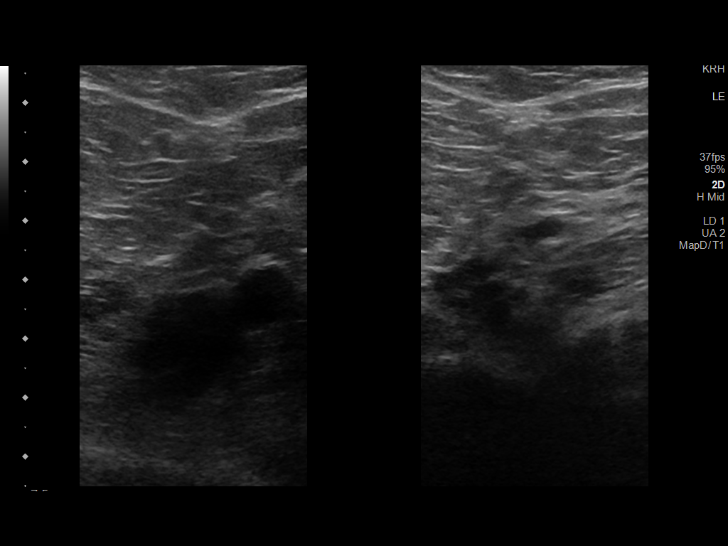
[im 7/39]
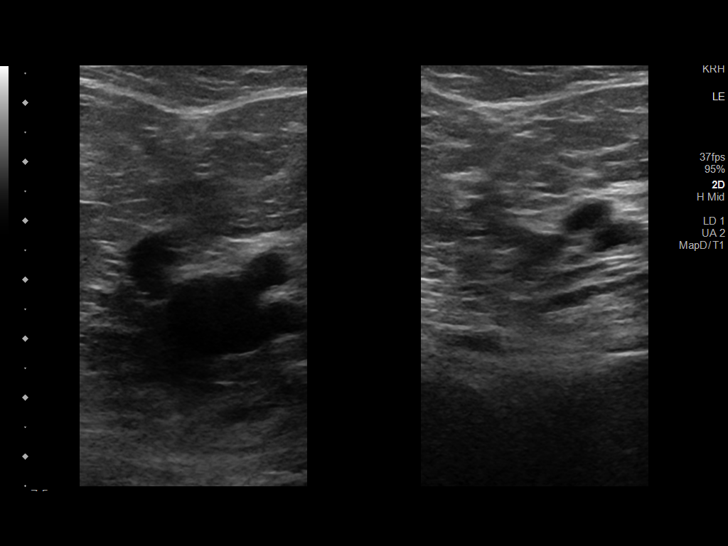
[im 10/39]
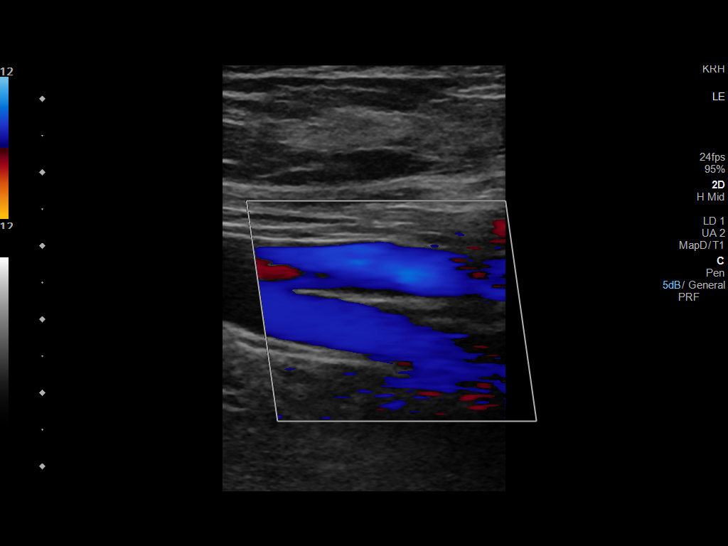
[im 14/39]
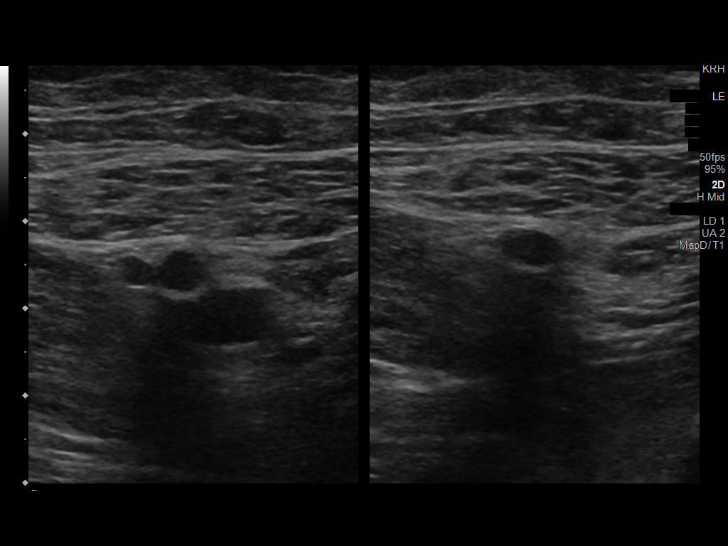
[im 17/39]
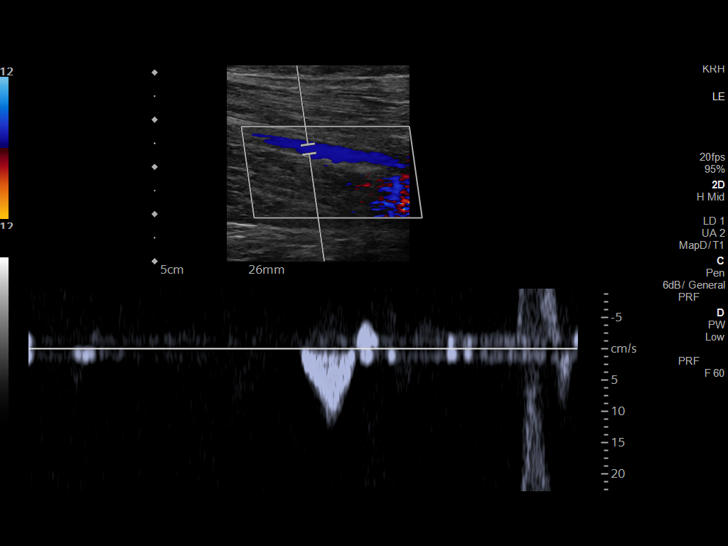
[im 20/39]
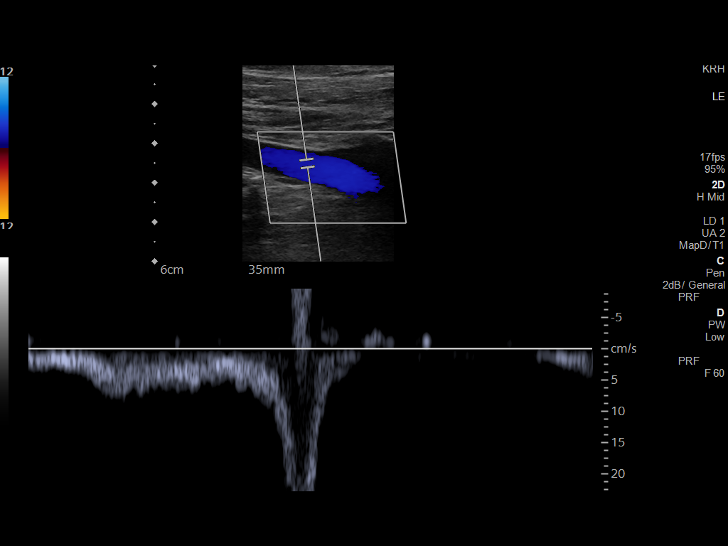
[im 22/39]
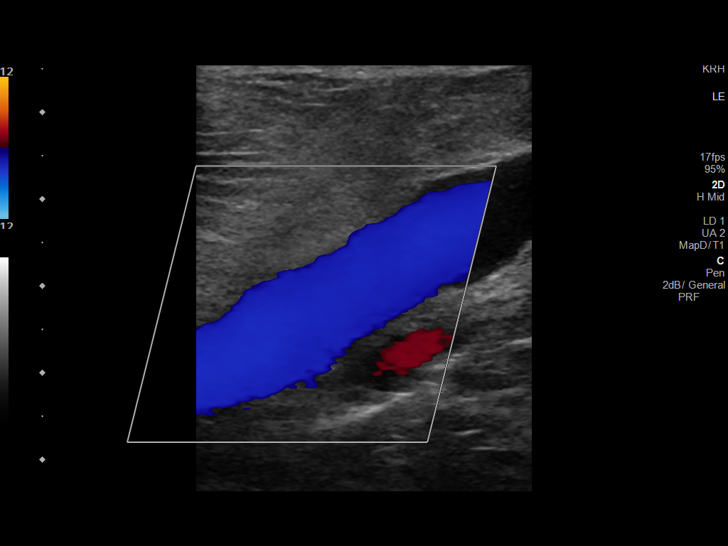
[im 25/39]
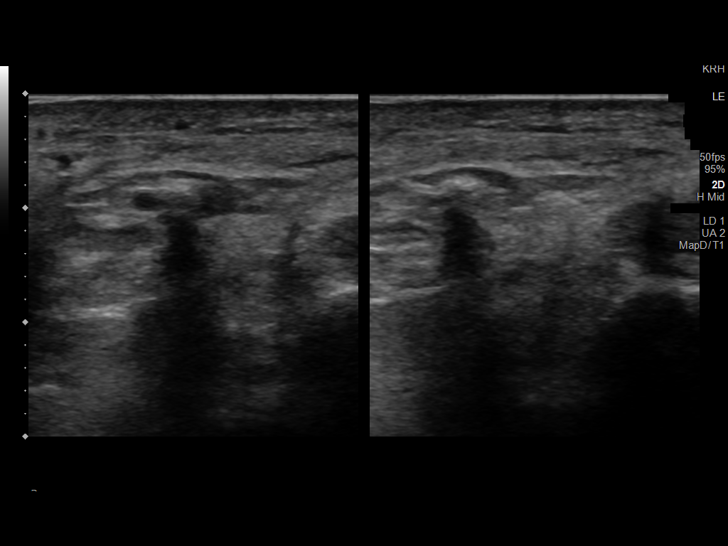
[im 29/39]
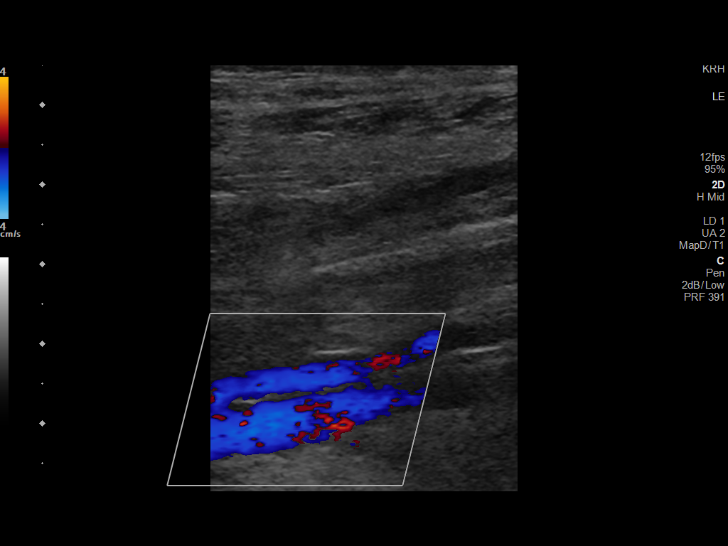
[im 32/39]
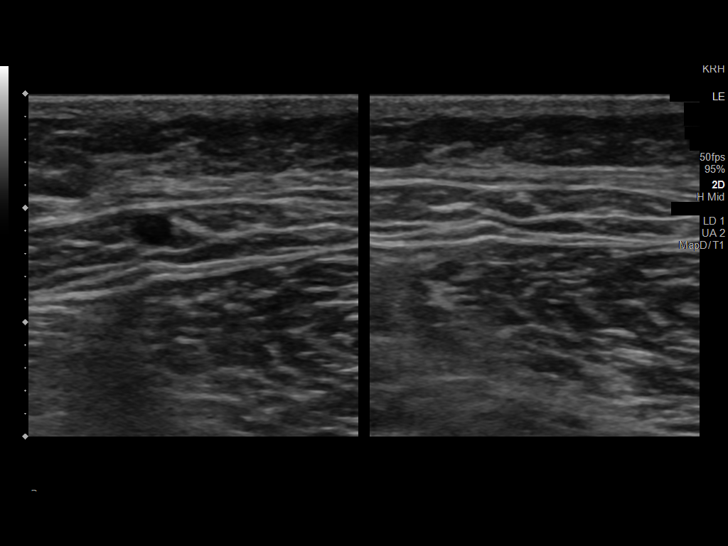
[im 35/39]
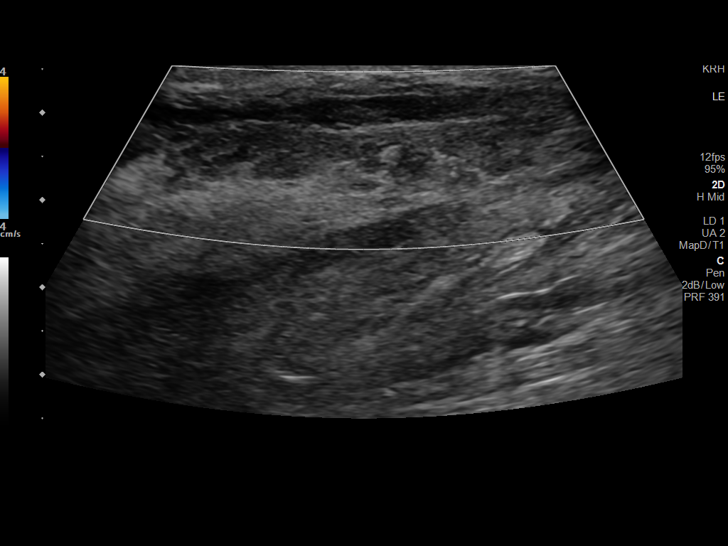
[im 39/39]
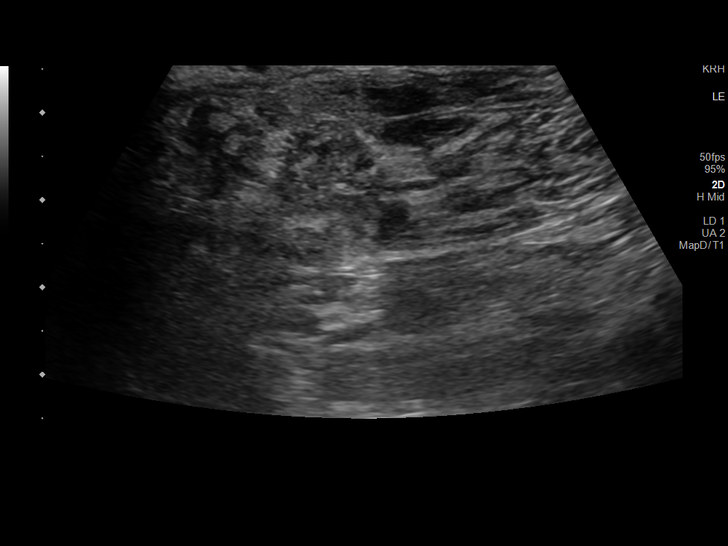

[13 of 24 positions shown; findings below may reference images not displayed]

FINDINGS: Contralateral Common Femoral Vein: Respiratory phasicity is normal
and symmetric with the symptomatic side. No evidence of thrombus.
Normal compressibility.

Common Femoral Vein: No evidence of thrombus. Normal
compressibility, respiratory phasicity and response to augmentation.

Saphenofemoral Junction: No evidence of thrombus. Normal
compressibility and flow on color Doppler imaging.

Profunda Femoral Vein: No evidence of thrombus. Normal
compressibility and flow on color Doppler imaging.

Femoral Vein: No evidence of thrombus. Normal compressibility,
respiratory phasicity and response to augmentation.

Popliteal Vein: No evidence of thrombus. Normal compressibility,
respiratory phasicity and response to augmentation.

Calf Veins: No evidence of thrombus. Normal compressibility and flow
on color Doppler imaging.

Superficial Great Saphenous Vein: No evidence of thrombus. Normal
compressibility.

Venous Reflux:  None.

Other Findings: Incidentally noted is fluid tracking along the
myofascial planes of the posterolateral calf.
IMPRESSION: 1. No acute DVT identified on this exam.
2. Fluid tracking along the myofascial planes of the posterolateral
calf may represent an underlying muscle strain in the appropriate
clinical setting. Other differential considerations include a small
hematoma.

## 2024-04-28 ENCOUNTER — Telehealth (HOSPITAL_BASED_OUTPATIENT_CLINIC_OR_DEPARTMENT_OTHER): Payer: Self-pay

## 2024-04-28 NOTE — Telephone Encounter (Signed)
 Copied from CRM [id number]. Topic: Appointments - Scheduling Inquiry for Clinic >> Apr 24, 2024  8:51 AM Celestine FALCON wrote: Reason for CRM: Pt is needing to schedule an appt for worker's comp due to falling at work on 04/13/2024, and fracturing his ribs as well as experiencing pneumothorax. The pt's employer is [employer]. Out of [place], [place]. I have added that to the pt's chart, but when scheduling it asked for me to deny and send a CRM to the admin pool. Grayce the case manager is requesting the patient to be seen by Dr. Kara.   Robin's phone number [phone] ok to leave a vm. >> Apr 24, 2024 10:08 AM Charlanne KIDD wrote: Florence Grayce, case worker, and advised, per protocol, to send fax to [phone]. She stated he needed to be seen ASAP. I told her I would let Team Leader know.   Routing to Clinical - Would Dr. Kara be willing to f/u with this patient through a HSFU slot, or any other provider for a secondary option?   ** Patient's case worker called to schedule a consult with Dr. Kara (specifically req). Pt's hospital admission is documented in Care Everywhere with [facility]. There is not a referral or additional notes to f/u upon d/c.

## 2024-04-28 NOTE — Telephone Encounter (Signed)
 Dr. Kara your next slot is HFU 9/25, would you like to  use spot for this patient? You have nothing sooner.

## 2024-04-28 NOTE — Telephone Encounter (Signed)
 Yes ok to use the 9/25 appointment slot for follow up. Can add him to cancellation list if any slots open up earlier.  Thanks, JD

## 2024-04-29 NOTE — Telephone Encounter (Signed)
 Patient called and scheduled.

## 2024-06-01 ENCOUNTER — Ambulatory Visit (INDEPENDENT_AMBULATORY_CARE_PROVIDER_SITE_OTHER): Payer: Self-pay | Admitting: Pulmonary Disease

## 2024-06-01 ENCOUNTER — Encounter: Payer: Self-pay | Admitting: Pulmonary Disease

## 2024-06-01 ENCOUNTER — Ambulatory Visit (INDEPENDENT_AMBULATORY_CARE_PROVIDER_SITE_OTHER): Payer: Self-pay

## 2024-06-01 VITALS — BP 123/76 | HR 72 | Ht 72.0 in | Wt 219.0 lb

## 2024-06-01 DIAGNOSIS — J90 Pleural effusion, not elsewhere classified: Secondary | ICD-10-CM

## 2024-06-01 DIAGNOSIS — S2242XS Multiple fractures of ribs, left side, sequela: Secondary | ICD-10-CM | POA: Diagnosis not present

## 2024-06-01 NOTE — Patient Instructions (Addendum)
 Your Chest x-ray shows a clear left lung with no fluid build up  Your rib fractures will take 6-10 weeks to fully heal and you may continue to have discomfort in those areas for months  You are safe to return to work  Follow up as needed

## 2024-06-01 NOTE — Progress Notes (Signed)
 Subjective:   PATIENT ID: Nicholas Stevenson GENDER: male DOB: 07-28-1951, MRN: [id number]   Chief Complaint  Patient presents with   Consult    Hospital F/U   Nicholas Stevenson is a 73 year old male, never smoker with history of rheumatoid arthritis and atrial fibrillation/flutter s/p ablation on Xarelto who is referred to pulmonary clinic for hospital follow up for pleural effusion and pneumothorax.  He fell from a work truck in August on to his left back side suffering multiple close rib fractures and report of small hydrohemothorax not requiring chest tube or thoracentesis. Serial imaging showed resolution of pneumothorax while in hospital.  Repeat chest x-ray today does not indicate pneumothorax or pleural fluid build up of left lung. Imaging reviewed with patient and wife.   He denies respiratory issues prior to fall and currently. No cough, wheezing, dyspnea. He remains active playing baseball and singing in a choir.   He is a never smoker. Retired, continues part time work.   Past Medical History:  Diagnosis Date   Atrial fibrillation (HCC)    Diabetes mellitus without complication (HCC)    Hypertension      No family history on file.   Social History   Socioeconomic History   Marital status: Married    Spouse name: Not on file   Number of children: Not on file   Years of education: Not on file   Highest education level: Not on file  Occupational History   Not on file  Tobacco Use   Smoking status: Never   Smokeless tobacco: Never  Vaping Use   Vaping status: Never Used  Substance and Sexual Activity   Alcohol use: Never   Drug use: Never   Sexual activity: Not on file  Other Topics Concern   Not on file  Social History Narrative   Not on file   Social Drivers of Health   Financial Resource Strain: Patient Declined (09/24/2023)   Received from [facility]   Overall Financial Resource Strain (CARDIA)    Difficulty of Paying Living Expenses: Patient declined   Food Insecurity: No Food Insecurity (04/13/2024)   Received from [facility]   Hunger Vital Sign    Within the past 12 months, you worried that your food would run out before you got the money to buy more.: Never true    Within the past 12 months, the food you bought just didn't last and you didn't have money to get more.: Never true  Transportation Needs: No Transportation Needs (04/17/2024)   Received from [facility] - Transportation    In the past 12 months, has lack of transportation kept you from medical appointments or from getting medications?: No    In the past 12 months, has lack of transportation kept you from meetings, work, or from getting things needed for daily living?: No  Physical Activity: Not on file  Stress: No Stress Concern Present (04/13/2024)   Received from [facility] - Occupational Stress Questionnaire    Do you feel stress - tense, restless, nervous, or anxious, or unable to sleep at night because your mind is troubled all the time - these days?: Only a little  Social Connections: Not on file  Intimate Partner Violence: Not At Risk (05/03/2024)   Received from [facility]   HITS    Over the last 12 months how often did your partner physically hurt you?: Never    Over  the last 12 months how often did your partner insult you or talk down to you?: Never    Over the last 12 months how often did your partner threaten you with physical harm?: Never    Over the last 12 months how often did your partner scream or curse at you?: Never     Allergies  Allergen Reactions   Methocarbamol Other (See Comments)    Hives      Outpatient Medications Prior to Visit  Medication Sig Dispense Refill   omeprazole (PRILOSEC) 20 MG capsule Take 20 mg by mouth daily.     rivaroxaban (XARELTO) 20 MG TABS tablet Take 20 mg by mouth daily with supper.     aspirin EC 81 MG tablet Take 81 mg by mouth daily.     Celecoxib  (CELEBREX PO) Take by mouth.     dilTIAZem HCl Coated Beads (DILTIAZEM CD PO) Take by mouth.     doxycycline  (VIBRAMYCIN ) 100 MG capsule Take 1 capsule (100 mg total) by mouth 2 (two) times daily. 14 capsule 0   LISINOPRIL PO Take by mouth.     METFORMIN HCL PO Take by mouth.     METOPROLOL TARTRATE PO Take by mouth.     predniSONE  (DELTASONE ) 10 MG tablet 6,5,4,3,2,1, taper 21 tablet 0   No facility-administered medications prior to visit.   Review of Systems  Constitutional:  Negative for chills, fever, malaise/fatigue and weight loss.  HENT:  Negative for congestion, sinus pain and sore throat.   Eyes: Negative.   Respiratory:  Negative for cough, hemoptysis, sputum production, shortness of breath and wheezing.   Cardiovascular:  Negative for chest pain, palpitations, orthopnea, claudication and leg swelling.  Gastrointestinal:  Negative for abdominal pain, heartburn, nausea and vomiting.  Genitourinary: Negative.   Musculoskeletal:  Negative for joint pain and myalgias.       Some residual left sided pain from fractures  Skin:  Negative for rash.  Neurological:  Negative for weakness.  Endo/Heme/Allergies: Negative.   Psychiatric/Behavioral: Negative.     Objective:   Vitals:   06/01/24 1005  BP: 123/76  Pulse: 72  SpO2: 98%  Weight: 219 lb (99.3 kg)  Height: 6' (1.829 m)   Physical Exam Constitutional:      General: He is not in acute distress.    Appearance: Normal appearance.  Eyes:     General: No scleral icterus.    Conjunctiva/sclera: Conjunctivae normal.  Cardiovascular:     Rate and Rhythm: Normal rate and regular rhythm.  Pulmonary:     Breath sounds: No wheezing, rhonchi or rales.  Musculoskeletal:     Right lower leg: No edema.     Left lower leg: No edema.  Skin:    General: Skin is warm and dry.  Neurological:     General: No focal deficit present.    CBC No results found for: WBC, RBC, HGB, HCT, PLT, MCV, MCH, MCHC, RDW,  LYMPHSABS, MONOABS, EOSABS, BASOSABS   Chest imaging: CXR 04/14/24 Small left hemopneumothorax similar in appearance to 04/13/2024   The multiple left rib fracture seen on CT scan of 04/09/2024 oh poorly evaluated on this exam.   Bibasilar densities which may represent atelectasis infiltrates are considered less likely.   PFT:     No data to display          Labs:  Path:  Echo:  Heart Catheterization:     Assessment & Plan:   Closed fracture of multiple ribs of left  side, sequela  Pleural effusion, left - Plan: DG Chest 2 View  Assessment and Plan Nicholas Stevenson is a 73 year old male, never smoker with history of rheumatoid arthritis and atrial fibrillation/flutter s/p ablation on Xarelto who is referred to pulmonary clinic for hospital follow up for pleural effusion and pneumothorax.  Close Left Rib Fractures - discussed 6-10 week healing process - discomfort pain is slowly improving  Left Hemopneumothorax - no evidence of pneumothorax or pleural effusion on repeat imaging - stable to return to work from pulmonary perspective  Atrial Flutter/Fibrillation - on metoprolol and Xarelto - discussed option for Watchman procedure to avoid long term anticoagulation and bleeding risks. He will discuss further with his cardiology team.  Follow up as needed  Dorn Chill, MD [facility] Office: [phone]   Current Outpatient Medications:    omeprazole (PRILOSEC) 20 MG capsule, Take 20 mg by mouth daily., Disp: , Rfl:    rivaroxaban (XARELTO) 20 MG TABS tablet, Take 20 mg by mouth daily with supper., Disp: , Rfl:    aspirin EC 81 MG tablet, Take 81 mg by mouth daily., Disp: , Rfl:    Celecoxib (CELEBREX PO), Take by mouth., Disp: , Rfl:    dilTIAZem HCl Coated Beads (DILTIAZEM CD PO), Take by mouth., Disp: , Rfl:    doxycycline  (VIBRAMYCIN ) 100 MG capsule, Take 1 capsule (100 mg total) by mouth 2 (two) times daily., Disp: 14 capsule,  Rfl: 0   LISINOPRIL PO, Take by mouth., Disp: , Rfl:    METFORMIN HCL PO, Take by mouth., Disp: , Rfl:    METOPROLOL TARTRATE PO, Take by mouth., Disp: , Rfl:    predniSONE  (DELTASONE ) 10 MG tablet, 6,5,4,3,2,1, taper, Disp: 21 tablet, Rfl: 0

## 2024-06-03 ENCOUNTER — Telehealth: Payer: Self-pay | Admitting: Pulmonary Disease

## 2024-06-03 NOTE — Telephone Encounter (Signed)
 Copied from CRM [id number]. Topic: General - Other >> Jun 03, 2024  8:37 AM Corean SAUNDERS wrote: Reason for CRM: Grayce, the patients worker compensation case manager states she still has not received required documents from Dr. Kara such as office notes and test results.   Please fax to [phone] Grayce can be reached at [phone]  Patient's case worker requesting update. Can this be sent once imaging results are complete?

## 2024-06-08 NOTE — Telephone Encounter (Signed)
 I faxed the OV note and the CXR results to [phone].  Received a confirmation fax that it had been sent successfully.  I attempted to call Robin at  [phone] and each time when I finished dialing the number disappeared from the phone screen, there was no ring, nothing.
